# Patient Record
Sex: Female | Born: 1990 | Race: White | Hispanic: No | Smoking: Current some day smoker
Health system: Southern US, Community
[De-identification: ages and names within clinical notes are randomized; demographics above are authoritative.]

## PROBLEM LIST (undated history)

## (undated) DIAGNOSIS — I10 Essential (primary) hypertension: Secondary | ICD-10-CM

---

## 2014-05-26 ENCOUNTER — Emergency Department (HOSPITAL_COMMUNITY): Payer: Self-pay

## 2014-05-26 ENCOUNTER — Encounter (HOSPITAL_COMMUNITY): Payer: Self-pay | Admitting: *Deleted

## 2014-05-26 ENCOUNTER — Emergency Department (HOSPITAL_COMMUNITY)
Admission: EM | Admit: 2014-05-26 | Discharge: 2014-05-26 | Disposition: A | Discharge status: Home / Self Care | Payer: Self-pay | Attending: Emergency Medicine | Admitting: Emergency Medicine

## 2014-05-26 DIAGNOSIS — Z23 Encounter for immunization: Secondary | ICD-10-CM | POA: Insufficient documentation

## 2014-05-26 DIAGNOSIS — Y9389 Activity, other specified: Secondary | ICD-10-CM | POA: Insufficient documentation

## 2014-05-26 DIAGNOSIS — Y9289 Other specified places as the place of occurrence of the external cause: Secondary | ICD-10-CM | POA: Insufficient documentation

## 2014-05-26 DIAGNOSIS — Z720 Tobacco use: Secondary | ICD-10-CM | POA: Insufficient documentation

## 2014-05-26 DIAGNOSIS — W01190A Fall on same level from slipping, tripping and stumbling with subsequent striking against furniture, initial encounter: Secondary | ICD-10-CM | POA: Insufficient documentation

## 2014-05-26 DIAGNOSIS — Z91040 Latex allergy status: Secondary | ICD-10-CM | POA: Insufficient documentation

## 2014-05-26 DIAGNOSIS — S0512XA Contusion of eyeball and orbital tissues, left eye, initial encounter: Secondary | ICD-10-CM

## 2014-05-26 DIAGNOSIS — S0990XA Unspecified injury of head, initial encounter: Secondary | ICD-10-CM

## 2014-05-26 DIAGNOSIS — Y998 Other external cause status: Secondary | ICD-10-CM | POA: Insufficient documentation

## 2014-05-26 DIAGNOSIS — S060X0A Concussion without loss of consciousness, initial encounter: Secondary | ICD-10-CM | POA: Insufficient documentation

## 2014-05-26 DIAGNOSIS — I10 Essential (primary) hypertension: Secondary | ICD-10-CM | POA: Insufficient documentation

## 2014-05-26 DIAGNOSIS — S0181XA Laceration without foreign body of other part of head, initial encounter: Secondary | ICD-10-CM | POA: Insufficient documentation

## 2014-05-26 HISTORY — DX: Essential (primary) hypertension: I10

## 2014-05-26 MED ORDER — IBUPROFEN 800 MG PO TABS
800.0000 mg | ORAL_TABLET | Freq: Once | ORAL | Status: AC
  Administered 2014-05-26: 800 mg via ORAL
  Filled 2014-05-26: qty 2

## 2014-05-26 MED ORDER — OXYCODONE-ACETAMINOPHEN 5-325 MG PO TABS
1.0000 | ORAL_TABLET | Freq: Once | ORAL | Status: AC
  Administered 2014-05-26: 1 via ORAL
  Filled 2014-05-26: qty 1

## 2014-05-26 MED ORDER — ONDANSETRON 8 MG PO TBDP
8.0000 mg | ORAL_TABLET | Freq: Once | ORAL | Status: AC
  Administered 2014-05-26: 8 mg via ORAL
  Filled 2014-05-26: qty 1

## 2014-05-26 MED ORDER — ONDANSETRON 4 MG PO TBDP
4.0000 mg | ORAL_TABLET | Freq: Once | ORAL | Status: AC
  Administered 2014-05-26: 4 mg via ORAL
  Filled 2014-05-26: qty 1

## 2014-05-26 MED ORDER — METOPROLOL TARTRATE 25 MG PO TABS: 25.0000 mg | ORAL_TABLET | Freq: Two times a day (BID) | ORAL | Status: AC

## 2014-05-26 MED ORDER — TETANUS-DIPHTH-ACELL PERTUSSIS 5-2.5-18.5 LF-MCG/0.5 IM SUSP
0.5000 mL | Freq: Once | INTRAMUSCULAR | Status: AC
  Administered 2014-05-26: 0.5 mL via INTRAMUSCULAR
  Filled 2014-05-26: qty 0.5

## 2014-05-26 NOTE — ED Notes (Signed)
Pt assisted to bathroom and then taken to CT

## 2014-05-26 NOTE — ED Provider Notes (Signed)
CSN: [id number]     Arrival date & time 05/26/14  0354 History   First MD Initiated Contact with Patient 05/26/14 0414     Chief Complaint  Patient presents with  . Fall     Patient is a 23 y.o. female presenting with head injury. The history is provided by the patient.  Head Injury Time since incident:  9 hours Pain details:    Quality:  Aching   Severity:  Severe   Timing:  Constant   Progression:  Worsening Chronicity:  New Relieved by:  Nothing Worsened by:  Movement Associated symptoms: blurred vision and headache   Associated symptoms: no focal weakness and no loss of consciousness   Patient reports that over 9 hours ago, she tripped and fell into a marble table.  She reports during the fall she struck the left side of her face causing bruising and laceration to her face.  She reports she was on the way to a photo shoot in [place] so she did not seek medical care immediately.  She reports after arriving for photo shoot it was cancelled as she was actively bleeding from her face.  She reports continued facial pain and headache.  She also reports feeling drowsy.  She reports blurred vision that is improving - she denies diplopia.  No focal weakness in her extremities.    She denies any other injuries.    Past Medical History  Diagnosis Date  . Hypertension    History reviewed. No pertinent past surgical history. History reviewed. No pertinent family history. History  Substance Use Topics  . Smoking status: Current Some Day Smoker  . Smokeless tobacco: Not on file  . Alcohol Use: No   OB History    No data available     Review of Systems  Eyes: Positive for blurred vision.  Cardiovascular: Negative for chest pain.  Gastrointestinal: Negative for abdominal pain.  Musculoskeletal: Negative for back pain.  Skin: Positive for wound.  Neurological: Positive for headaches. Negative for focal weakness and loss of consciousness.  All other systems reviewed and are  negative.     Allergies  Hydrocodone and Latex  Home Medications   Prior to Admission medications   Not on File   BP 159/89 mmHg  Pulse 122  Temp(Src) 98.1 F (36.7 C) (Oral)  Resp 26  Ht 5\' 3"  (1.6 m)  Wt 113 lb (51.256 kg)  BMI 20.02 kg/m2  SpO2 100% Physical Exam CONSTITUTIONAL: Well developed/well nourished HEAD: Normocephalic/atraumatic EYES: EOMI/PERRL. No proptosis.  No hyphema.  No subconjunctival hemorrhage noted. ENMT: Mucous membranes moist.  Left periorbital ecchymoses.  No facial crepitus or stepoff.  There is a laceration to left maxilla.  There is makeup over the wound.  There is no active bleeding.  No dental injury.  There is no nasal septal hematoma.  No hemotympanum NECK: supple no meningeal signs SPINE:entire spine nontender CV: S1/S2 noted, no murmurs/rubs/gallops noted LUNGS: Lungs are clear to auscultation bilaterally, no apparent distress ABDOMEN: soft, nontender, no rebound or guarding, no bruising noted NEURO: Pt is awake/alert, moves all extremitiesx4.  She initially appears unsteady while walking, however she corrects and has no ataxia.  GCS - 15 EXTREMITIES: pulses normal, full ROM. No wounds noted to either hand SKIN: warm, color normal PSYCH: anxious  ED Course  Procedures  4:33 AM Pt presents with left facial bruising/wound.  She reports it is over 9 hours ago that it occurred, however wound actually appears partially healed which would  indicate it might be older.  At this point, wound is not amenable for repair.   I asked if she felt safe at home and she reports that she does feel safe Nursing also asked patient this question and she reported feeling safe at home  She reports she has h/o HTN and does not have her meds - she reports she takes metoprolol 250mg  BID.  5:17 AM CT imaging negative Pt did have vomiting after oral pain meds, given zofran Discussed imaging results Also, will give Rx for her metoprolol but will start at low  dose. Advised need for outpatient management of her HTN  Imaging Review Ct Head Wo Contrast  05/26/2014   CLINICAL DATA:  Status post fall 1 day ago with a blow to the left side of the face. Laceration left cheek. Initial encounter.  EXAM: CT HEAD WITHOUT CONTRAST  CT MAXILLOFACIAL WITHOUT CONTRAST  TECHNIQUE: Multidetector CT imaging of the head and maxillofacial structures were performed using the standard protocol without intravenous contrast. Multiplanar CT image reconstructions of the maxillofacial structures were also generated.  COMPARISON:  None.  FINDINGS: CT HEAD FINDINGS  The brain appears normal without hemorrhage, infarct, mass lesion, mass effect, midline shift or abnormal extra-axial fluid collection. No hydrocephalus or pneumocephalus. The calvarium is intact. Imaged paranasal sinuses and mastoid air cells are clear.  CT MAXILLOFACIAL FINDINGS  Hematoma is seen over the left maxilla without underlying fracture. The facial bones are intact. The mandibular condyles are located. The globes are intact and the lenses are located. Orbital fat is clear. Imaged upper cervical spine appears normal. The paranasal sinuses and mastoid air cells are clear.  IMPRESSION: Soft tissue contusion over the left maxilla without underlying fracture.  Negative head CT.   Electronically Signed   By: Drusilla Kannerhomas  Dalessio M.D.   On: 05/26/2014 04:52   Ct Maxillofacial Wo Cm  05/26/2014   CLINICAL DATA:  Status post fall 1 day ago with a blow to the left side of the face. Laceration left cheek. Initial encounter.  EXAM: CT HEAD WITHOUT CONTRAST  CT MAXILLOFACIAL WITHOUT CONTRAST  TECHNIQUE: Multidetector CT imaging of the head and maxillofacial structures were performed using the standard protocol without intravenous contrast. Multiplanar CT image reconstructions of the maxillofacial structures were also generated.  COMPARISON:  None.  FINDINGS: CT HEAD FINDINGS  The brain appears normal without hemorrhage, infarct, mass  lesion, mass effect, midline shift or abnormal extra-axial fluid collection. No hydrocephalus or pneumocephalus. The calvarium is intact. Imaged paranasal sinuses and mastoid air cells are clear.  CT MAXILLOFACIAL FINDINGS  Hematoma is seen over the left maxilla without underlying fracture. The facial bones are intact. The mandibular condyles are located. The globes are intact and the lenses are located. Orbital fat is clear. Imaged upper cervical spine appears normal. The paranasal sinuses and mastoid air cells are clear.  IMPRESSION: Soft tissue contusion over the left maxilla without underlying fracture.  Negative head CT.   Electronically Signed   By: Drusilla Kannerhomas  Dalessio M.D.   On: 05/26/2014 04:52      MDM   Final diagnoses:  Head injury  Laceration of face, initial encounter  Periorbital contusion, left, initial encounter  Concussion without loss of consciousness, initial encounter    Nursing notes including past medical history and social history reviewed and considered in documentation Narcotic database reviewed and considered in decision making    Joya Gaskinsonald W Paddy Neis, MD 05/26/14 [phone]

## 2014-05-26 NOTE — ED Notes (Signed)
Pt c/o fall x 10 hours ago; pt states she fell on a marble table; pt has abrasion to left cheek; pt has swelling and bruising to left eye and cheek; pt states her head is hurting and she feels more drowsy than usual

## 2014-05-26 NOTE — Discharge Instructions (Signed)

## 2015-03-23 ENCOUNTER — Emergency Department (HOSPITAL_COMMUNITY)
Admission: EM | Admit: 2015-03-23 | Discharge: 2015-03-23 | Disposition: A | Discharge status: Home / Self Care | Payer: Medicaid - Out of State | Attending: Emergency Medicine | Admitting: Emergency Medicine

## 2015-03-23 ENCOUNTER — Emergency Department (HOSPITAL_COMMUNITY): Payer: Medicaid - Out of State

## 2015-03-23 ENCOUNTER — Encounter (HOSPITAL_COMMUNITY): Payer: Self-pay | Admitting: Emergency Medicine

## 2015-03-23 DIAGNOSIS — R102 Pelvic and perineal pain: Secondary | ICD-10-CM | POA: Diagnosis present

## 2015-03-23 DIAGNOSIS — N730 Acute parametritis and pelvic cellulitis: Secondary | ICD-10-CM | POA: Insufficient documentation

## 2015-03-23 DIAGNOSIS — Z720 Tobacco use: Secondary | ICD-10-CM | POA: Diagnosis not present

## 2015-03-23 DIAGNOSIS — I10 Essential (primary) hypertension: Secondary | ICD-10-CM | POA: Diagnosis not present

## 2015-03-23 DIAGNOSIS — Z3202 Encounter for pregnancy test, result negative: Secondary | ICD-10-CM | POA: Insufficient documentation

## 2015-03-23 DIAGNOSIS — N760 Acute vaginitis: Secondary | ICD-10-CM | POA: Insufficient documentation

## 2015-03-23 DIAGNOSIS — N898 Other specified noninflammatory disorders of vagina: Secondary | ICD-10-CM

## 2015-03-23 DIAGNOSIS — Z91040 Latex allergy status: Secondary | ICD-10-CM | POA: Insufficient documentation

## 2015-03-23 DIAGNOSIS — Z79899 Other long term (current) drug therapy: Secondary | ICD-10-CM | POA: Diagnosis not present

## 2015-03-23 DIAGNOSIS — R509 Fever, unspecified: Secondary | ICD-10-CM

## 2015-03-23 DIAGNOSIS — B9689 Other specified bacterial agents as the cause of diseases classified elsewhere: Secondary | ICD-10-CM

## 2015-03-23 LAB — URINALYSIS, ROUTINE W REFLEX MICROSCOPIC
BILIRUBIN URINE: NEGATIVE
Glucose, UA: NEGATIVE mg/dL
Hgb urine dipstick: NEGATIVE
KETONES UR: NEGATIVE mg/dL
LEUKOCYTES UA: NEGATIVE
Nitrite: NEGATIVE
PH: 6 (ref 5.0–8.0)
PROTEIN: NEGATIVE mg/dL
Specific Gravity, Urine: 1.015 (ref 1.005–1.030)
Urobilinogen, UA: 0.2 mg/dL (ref 0.0–1.0)

## 2015-03-23 LAB — WET PREP, GENITAL
Trich, Wet Prep: NONE SEEN
YEAST WET PREP: NONE SEEN

## 2015-03-23 LAB — RPR: RPR Ser Ql: NONREACTIVE

## 2015-03-23 LAB — HIV ANTIBODY (ROUTINE TESTING W REFLEX): HIV Screen 4th Generation wRfx: NONREACTIVE

## 2015-03-23 LAB — PREGNANCY, URINE: PREG TEST UR: NEGATIVE

## 2015-03-23 MED ORDER — DOXYCYCLINE HYCLATE 100 MG PO CAPS: 100.0000 mg | ORAL_CAPSULE | Freq: Two times a day (BID) | ORAL | Status: AC

## 2015-03-23 MED ORDER — CEFTRIAXONE SODIUM 250 MG IJ SOLR
250.0000 mg | Freq: Once | INTRAMUSCULAR | Status: AC
  Administered 2015-03-23: 250 mg via INTRAMUSCULAR
  Filled 2015-03-23: qty 250

## 2015-03-23 MED ORDER — STERILE WATER FOR INJECTION IJ SOLN
INTRAMUSCULAR | Status: AC
  Administered 2015-03-23: 10 mL
  Filled 2015-03-23: qty 10

## 2015-03-23 MED ORDER — NAPROXEN 500 MG PO TABS: ORAL_TABLET | ORAL | Status: AC

## 2015-03-23 MED ORDER — DOXYCYCLINE HYCLATE 100 MG PO TABS
100.0000 mg | ORAL_TABLET | Freq: Once | ORAL | Status: AC
  Administered 2015-03-23: 100 mg via ORAL
  Filled 2015-03-23: qty 1

## 2015-03-23 MED ORDER — METRONIDAZOLE 500 MG PO TABS
500.0000 mg | ORAL_TABLET | Freq: Once | ORAL | Status: AC
  Administered 2015-03-23: 500 mg via ORAL
  Filled 2015-03-23: qty 1

## 2015-03-23 MED ORDER — ACETAMINOPHEN 500 MG PO TABS
1000.0000 mg | ORAL_TABLET | Freq: Once | ORAL | Status: AC
  Administered 2015-03-23: 1000 mg via ORAL
  Filled 2015-03-23: qty 2

## 2015-03-23 MED ORDER — METRONIDAZOLE 500 MG PO TABS: 500.0000 mg | ORAL_TABLET | Freq: Two times a day (BID) | ORAL | Status: AC

## 2015-03-23 MED ORDER — KETOROLAC TROMETHAMINE 60 MG/2ML IM SOLN
60.0000 mg | Freq: Once | INTRAMUSCULAR | Status: AC
  Administered 2015-03-23: 60 mg via INTRAMUSCULAR
  Filled 2015-03-23: qty 2

## 2015-03-23 MED ORDER — ONDANSETRON 4 MG PO TBDP
4.0000 mg | ORAL_TABLET | Freq: Once | ORAL | Status: AC
  Administered 2015-03-23: 4 mg via ORAL
  Filled 2015-03-23: qty 1

## 2015-03-23 MED ORDER — OXYCODONE HCL 5 MG PO TABS
5.0000 mg | ORAL_TABLET | Freq: Once | ORAL | Status: AC
Start: 2015-03-23 — End: 2015-03-23
  Administered 2015-03-23: 5 mg via ORAL
  Filled 2015-03-23: qty 1

## 2015-03-23 NOTE — ED Notes (Signed)
Ultrasound tech to be called in.

## 2015-03-23 NOTE — ED Provider Notes (Signed)
24 year old female with a chief complaint of pelvic pain. Seen initially by Dr. Lynelle Doctor, ultrasound of the pelvis negative for acute etiology. Patient is being treated for PID as well as BV. Patient requesting narcotics feels like her home treatment is not working. Discussed in detail reasoning for not giving her opiates. Discussed the dosage and frequency of common NSAIDs as well as Tylenol dosing.  Melene Plan, DO 03/23/15 [phone]

## 2015-03-23 NOTE — ED Provider Notes (Signed)
CSN: [id number]     Arrival date & time 03/23/15  0320 History   First MD Initiated Contact with Patient 03/23/15 [phone]     Chief Complaint  Patient presents with  . Pelvic Pain  . Vaginal Discharge     (Consider location/radiation/quality/duration/timing/severity/associated sxs/prior Treatment) HPI  Patient is G1 P1 Ab0, states she has a Mirena for the past 4 years. Patient states she started having pelvic pain 1 month ago and has dyspareunia. She also reports moderate amount of watery discharge that is foul-smelling. She has taken over-the-counter creams without improvement. She reports she has had fever of 99.9 and 1-1/2 weeks ago was 102.2. She did not seek medical care at that time because she did not have insurance. She states she now has Medicaid. She has had nausea and some vomiting off and on. She also reports her legs in her thighs are painful. She denies dysuria but she does report urinating small amounts frequently. She states she's never had these symptoms before.  GYN Dr Christain Sacramento in [place] appt 9/13  Past Medical History  Diagnosis Date  . Hypertension    History reviewed. No pertinent past surgical history. History reviewed. No pertinent family history. Social History  Substance Use Topics  . Smoking status: Current Some Day Smoker  . Smokeless tobacco: None  . Alcohol Use: No  smokes 1/3 ppad unemployed  OB History    No data available     Review of Systems  All other systems reviewed and are negative.     Allergies  Hydrocodone and Latex  Home Medications   Prior to Admission medications   Medication Sig Start Date End Date Taking? Authorizing Provider  doxycycline (VIBRAMYCIN) 100 MG capsule Take 1 capsule (100 mg total) by mouth 2 (two) times daily. 03/23/15   Devoria Albe, MD  metoprolol (LOPRESSOR) 25 MG tablet Take 1 tablet (25 mg total) by mouth 2 (two) times daily. 05/26/14   Zadie Rhine, MD  metroNIDAZOLE (FLAGYL) 500 MG tablet Take 1 tablet (500  mg total) by mouth 2 (two) times daily. 03/23/15   Devoria Albe, MD  naproxen (NAPROSYN) 500 MG tablet Take 1 po BID with food prn pain 03/23/15   Devoria Albe, MD   BP 135/60 mmHg  Pulse 103  Temp(Src) 97.9 F (36.6 C) (Oral)  Resp 20  Ht  (1.6 m)  Wt 125 lb (56.7 kg)  BMI 22.15 kg/m2  SpO2 100%  Vital signs normal except for tachycardia  Physical Exam  Constitutional: She is oriented to person, place, and time. She appears well-developed and well-nourished.  Non-toxic appearance. She does not appear ill. No distress.  HENT:  Head: Normocephalic and atraumatic.  Right Ear: External ear normal.  Left Ear: External ear normal.  Nose: Nose normal. No mucosal edema or rhinorrhea.  Mouth/Throat: Oropharynx is clear and moist and mucous membranes are normal. No dental abscesses or uvula swelling.  Eyes: Conjunctivae and EOM are normal. Pupils are equal, round, and reactive to light.  Neck: Normal range of motion and full passive range of motion without pain. Neck supple.  Cardiovascular: Normal rate, regular rhythm and normal heart sounds.  Exam reveals no gallop and no friction rub.   No murmur heard. Pulmonary/Chest: Effort normal and breath sounds normal. No respiratory distress. She has no wheezes. She has no rhonchi. She has no rales. She exhibits no tenderness and no crepitus.  Abdominal: Soft. Normal appearance and bowel sounds are normal. She exhibits no distension. There is tenderness.  There is no rebound and no guarding.    Genitourinary:  Patient string from her Mirena was seen in the cervical os. There is a lot of white discharge in the vault. Her uterus is normal-sized and is tender to manipulation. Her left ovary is tender without healing enlarged. Her right ovary is tender and appears to be enlarged.  Musculoskeletal: Normal range of motion. She exhibits no edema or tenderness.  Moves all extremities well.   Neurological: She is alert and oriented to person, place, and time.  She has normal strength. No cranial nerve deficit.  Skin: Skin is warm, dry and intact. No rash noted. No erythema. No pallor.  Psychiatric: She has a normal mood and affect. Her speech is normal and behavior is normal. Her mood appears not anxious.  Nursing note and vitals reviewed.   ED Course  Procedures (including critical care time) Medications  ketorolac (TORADOL) injection 60 mg (60 mg Intramuscular Given 03/23/15 0549)  cefTRIAXone (ROCEPHIN) injection 250 mg (250 mg Intramuscular Given 03/23/15 0549)  doxycycline (VIBRA-TABS) tablet 100 mg (100 mg Oral Given 03/23/15 0549)  metroNIDAZOLE (FLAGYL) tablet 500 mg (500 mg Oral Given 03/23/15 0549)  ondansetron (ZOFRAN-ODT) disintegrating tablet 4 mg (4 mg Oral Given 03/23/15 0549)  sterile water (preservative free) injection (10 mLs  Given 03/23/15 0549)     In and out cath done due to her complaints of vaginal discharge. Patient was reluctant to have the procedure done.  Patient was given Toradol 60 mg IM after her pelvic exam. She was given Rocephin IM and started on the STD oral medications.  6:30 AM I talked to patient about her lab results. I feel due to the history of fever and her pain being more on the right side a pelvic ultrasound should be done to rule out a TOA. Patient also has a IUD in its placement needs to be verified. Patient is agreeable. The ultrasound tech is on call today because of the holiday (Labor Day) and is being called in. She states she still has some pressure discomfort in her abdomen. She appears less distressed after the toradol.   Pt turned over to Dr Adela Lank at change of shift to get results of her pelvic US/transvaginal US.    Review of the [place] database shows patient got #20 oxycodone 5/325 on 05/08/2014 and #10 oxycodone 5/325 on 07/09/2014. There are no entries in the [employer] Review Results for orders placed or performed during the hospital encounter of 03/23/15  Wet prep, genital    Result Value Ref Range   Yeast Wet Prep HPF POC NONE SEEN NONE SEEN   Trich, Wet Prep NONE SEEN NONE SEEN   Clue Cells Wet Prep HPF POC MANY (A) NONE SEEN   WBC, Wet Prep HPF POC MANY (A) NONE SEEN  Pregnancy, urine  Result Value Ref Range   Preg Test, Ur NEGATIVE NEGATIVE  Urinalysis, Routine w reflex microscopic  Result Value Ref Range   Color, Urine YELLOW YELLOW   APPearance CLEAR CLEAR   Specific Gravity, Urine 1.015 1.005 - 1.030   pH 6.0 5.0 - 8.0   Glucose, UA NEGATIVE NEGATIVE mg/dL   Hgb urine dipstick NEGATIVE NEGATIVE   Bilirubin Urine NEGATIVE NEGATIVE   Ketones, ur NEGATIVE NEGATIVE mg/dL   Protein, ur NEGATIVE NEGATIVE mg/dL   Urobilinogen, UA 0.2 0.0 - 1.0 mg/dL   Nitrite NEGATIVE NEGATIVE   Leukocytes, UA NEGATIVE NEGATIVE   Laboratory interpretation all normal except for bacterial  vaginosis     Imaging Review No results found.   Pelvic US pending  I have personally reviewed and evaluated these images and lab results as part of my medical decision-making.   EKG Interpretation None      MDM   Final diagnoses:  Vaginal discharge  Pelvic pain in female  Acute PID (pelvic inflammatory disease)  BV (bacterial vaginosis)    New Prescriptions   DOXYCYCLINE (VIBRAMYCIN) 100 MG CAPSULE    Take 1 capsule (100 mg total) by mouth 2 (two) times daily.   METRONIDAZOLE (FLAGYL) 500 MG TABLET    Take 1 tablet (500 mg total) by mouth 2 (two) times daily.   NAPROXEN (NAPROSYN) 500 MG TABLET    Take 1 po BID with food prn pain    Plan discharge  Devoria Albe, MD, Concha Pyo, MD 03/23/15 [phone]

## 2015-03-23 NOTE — ED Notes (Signed)
Patient complaining of pelvic pain, watery and white vaginal discharge. Patient states has had symptoms for about a month.

## 2015-03-23 NOTE — Discharge Instructions (Signed)
Take the antibiotics until gone. Take the naproxen for pain. Keep your appointment with Dr. Christain Sacramento on September 13. No sex until Dr. Christain Sacramento states it is okay. Return to the ED if you get high fever again, have uncontrolled vomiting, or worsening pain.   Bacterial Vaginosis Bacterial vaginosis is a vaginal infection that occurs when the normal balance of bacteria in the vagina is disrupted. It results from an overgrowth of certain bacteria. This is the most common vaginal infection in women of childbearing age. Treatment is important to prevent complications, especially in pregnant women, as it can cause a premature delivery. CAUSES  Bacterial vaginosis is caused by an increase in harmful bacteria that are normally present in smaller amounts in the vagina. Several different kinds of bacteria can cause bacterial vaginosis. However, the reason that the condition develops is not fully understood. RISK FACTORS Certain activities or behaviors can put you at an increased risk of developing bacterial vaginosis, including:  Having a new sex partner or multiple sex partners.  Douching.  Using an intrauterine device (IUD) for contraception. Women do not get bacterial vaginosis from toilet seats, bedding, swimming pools, or contact with objects around them. SIGNS AND SYMPTOMS  Some women with bacterial vaginosis have no signs or symptoms. Common symptoms include:  Grey vaginal discharge.  A fishlike odor with discharge, especially after sexual intercourse.  Itching or burning of the vagina and vulva.  Burning or pain with urination. DIAGNOSIS  Your health care provider will take a medical history and examine the vagina for signs of bacterial vaginosis. A sample of vaginal fluid may be taken. Your health care provider will look at this sample under a microscope to check for bacteria and abnormal cells. A vaginal pH test may also be done.  TREATMENT  Bacterial vaginosis may be treated with antibiotic  medicines. These may be given in the form of a pill or a vaginal cream. A second round of antibiotics may be prescribed if the condition comes back after treatment.  HOME CARE INSTRUCTIONS   Only take over-the-counter or prescription medicines as directed by your health care provider.  If antibiotic medicine was prescribed, take it as directed. Make sure you finish it even if you start to feel better.  Do not have sex until treatment is completed.  Tell all sexual partners that you have a vaginal infection. They should see their health care provider and be treated if they have problems, such as a mild rash or itching.  Practice safe sex by using condoms and only having one sex partner. SEEK MEDICAL CARE IF:   Your symptoms are not improving after 3 days of treatment.  You have increased discharge or pain.  You have a fever. MAKE SURE YOU:   Understand these instructions.  Will watch your condition.  Will get help right away if you are not doing well or get worse. FOR MORE INFORMATION  Centers for Disease Control and Prevention, Division of STD Prevention: [employer] American Sexual Health Association (ASHA): www.ashastd.org  Document Released: 07/04/2005 Document Revised: 04/24/2013 Document Reviewed: 02/13/2013 [facility] Patient Information 2015 [place], [place]. This information is not intended to replace advice given to you by your health care provider. Make sure you discuss any questions you have with your health care provider.  Pelvic Inflammatory Disease Pelvic inflammatory disease (PID) is an infection in some or all of the female organs. PID can be in the uterus, ovaries, fallopian tubes, or the surrounding tissues inside the lower belly area (pelvis). HOME  CARE   If given, take your antibiotic medicine as told. Finish them even if you start to feel better.  Only take medicine as told by your doctor.  Do not have sex (intercourse) until treatment is done or as told  by your doctor.  Tell your sex partner if you have PID. Your partner may need to be treated.  Keep all doctor visits. GET HELP RIGHT AWAY IF:   You have a fever.  You have more belly (abdominal) or lower belly pain.  You have chills.  You have pain when you pee (urinate).  You are not better after 72 hours.  You have more fluid (discharge) coming from your vagina or fluid that is not normal.  You need pain medicine from your doctor.  You throw up (vomit).  You cannot take your medicines.  Your partner has a sexually transmitted disease (STD). MAKE SURE YOU:   Understand these instructions.  Will watch your condition.  Will get help right away if you are not doing well or get worse. Document Released: 09/30/2008 Document Revised: 10/29/2012 Document Reviewed: 06/30/2011 [facility] Patient Information 2015 [place], [place]. This information is not intended to replace advice given to you by your health care provider. Make sure you discuss any questions you have with your health care provider.

## 2015-03-24 LAB — GC/CHLAMYDIA PROBE AMP (~~LOC~~) NOT AT ARMC
Chlamydia: NEGATIVE
NEISSERIA GONORRHEA: NEGATIVE

## 2015-09-10 IMAGING — US US TRANSVAGINAL NON-OB
1 series · 14 of 25 positions shown · non-contrast
Comparison: [facility] [HOSP[facility]] CT Abdomen and Pelvis
01/06/2014

CLINICAL DATA: 24-year-old female with right side pelvic pain,
fever, vaginal discharge. IUD. Initial encounter.



[Series 1: us transvaginal non-ob · 0.20mm/px · 65 acquisitions, 14 frames shown]
[im 1/65]
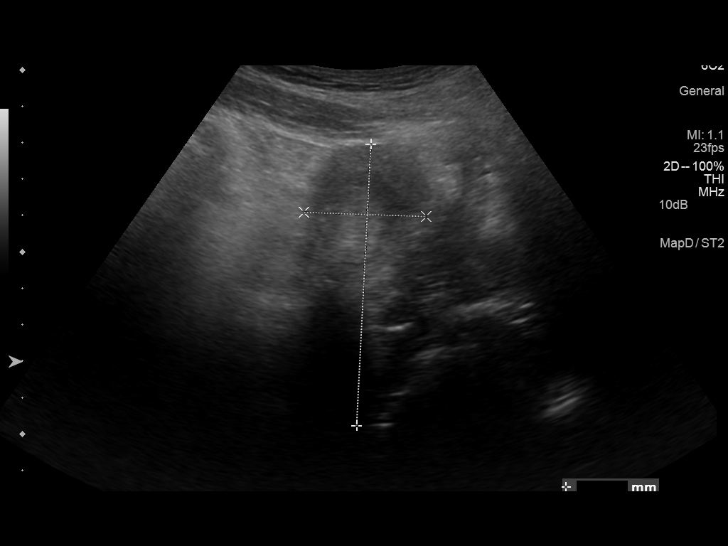
[im 6/65]
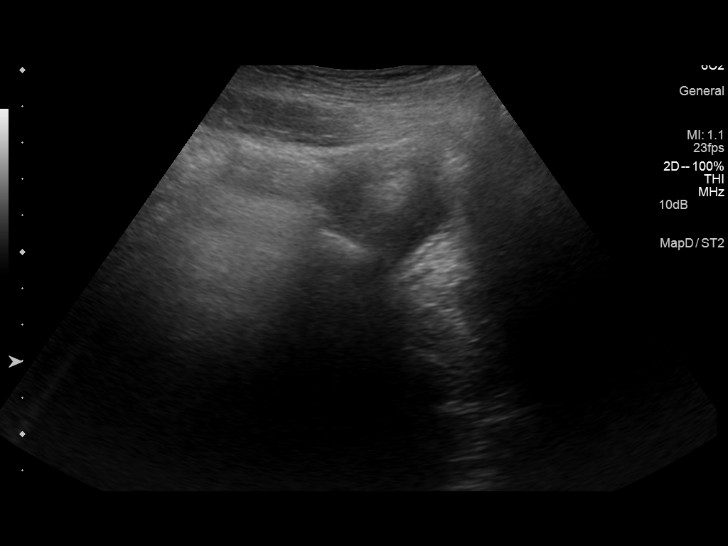
[im 11/65]
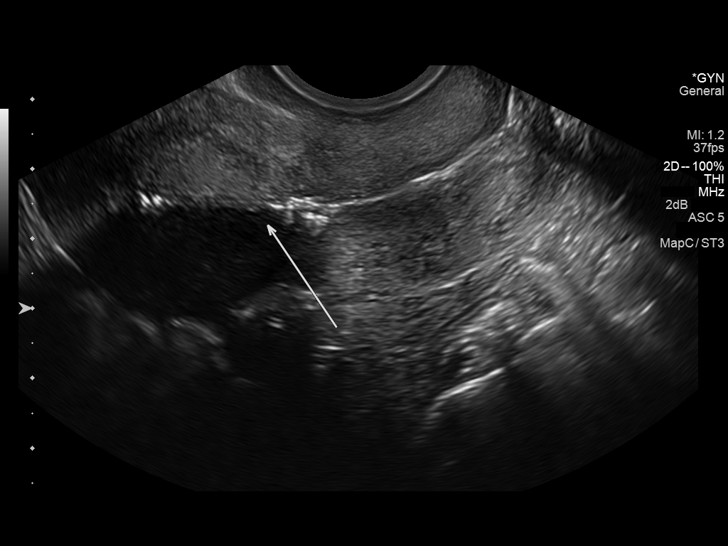
[im 17/65]
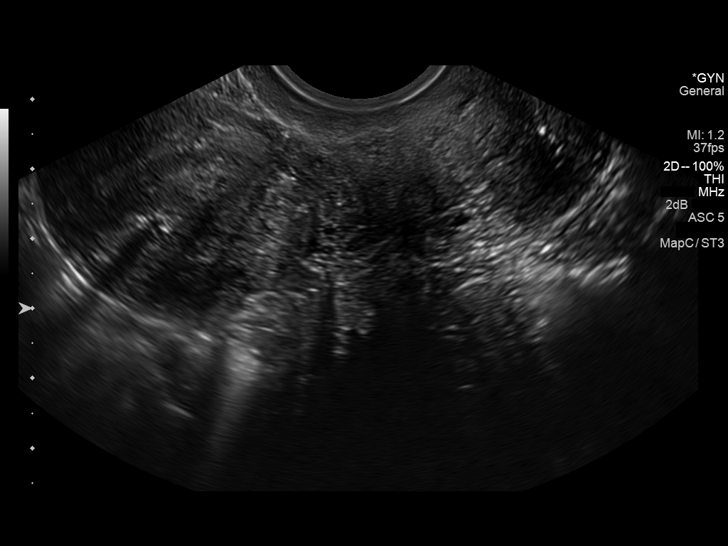
[im 22/65]
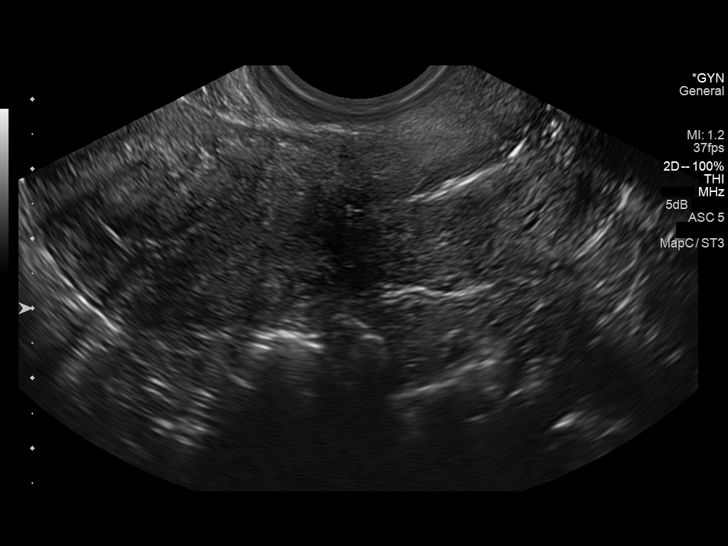
[im 25/65]
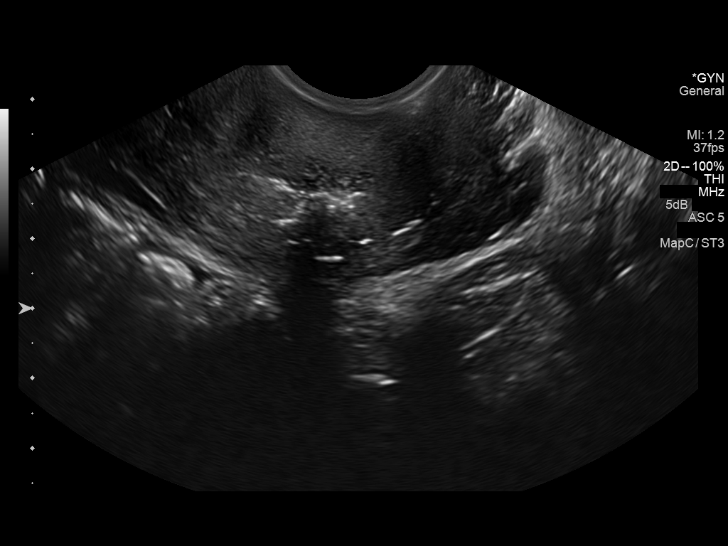
[im 30/65]
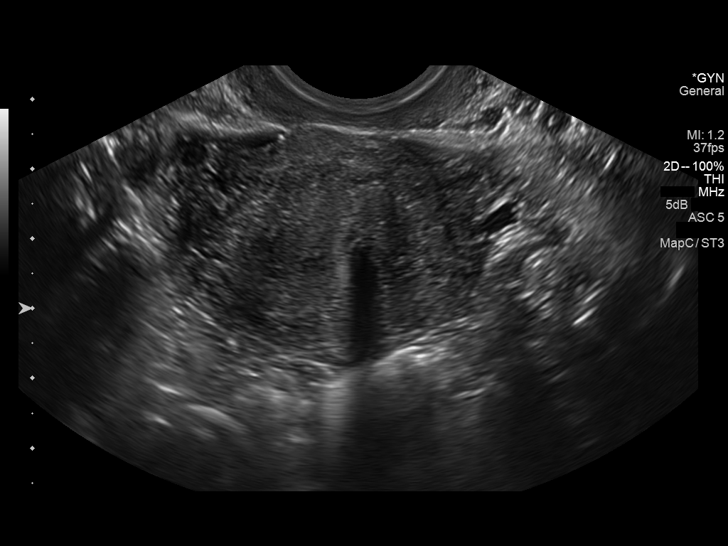
[im 35/65]
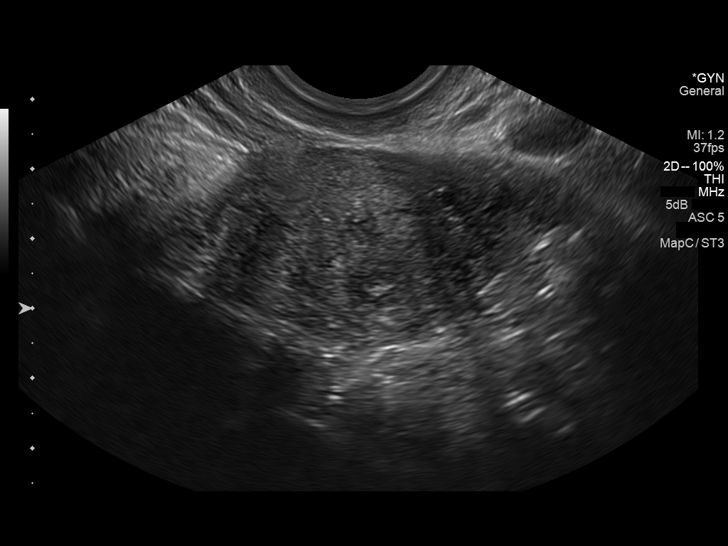
[im 41/65]
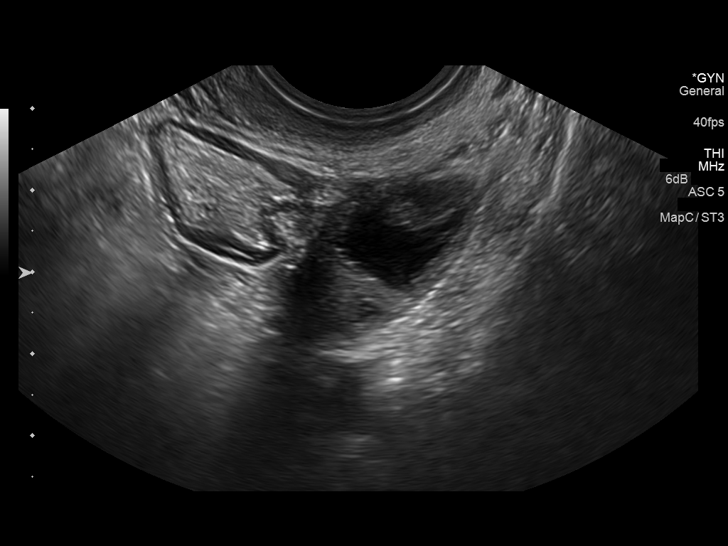
[im 43/65]
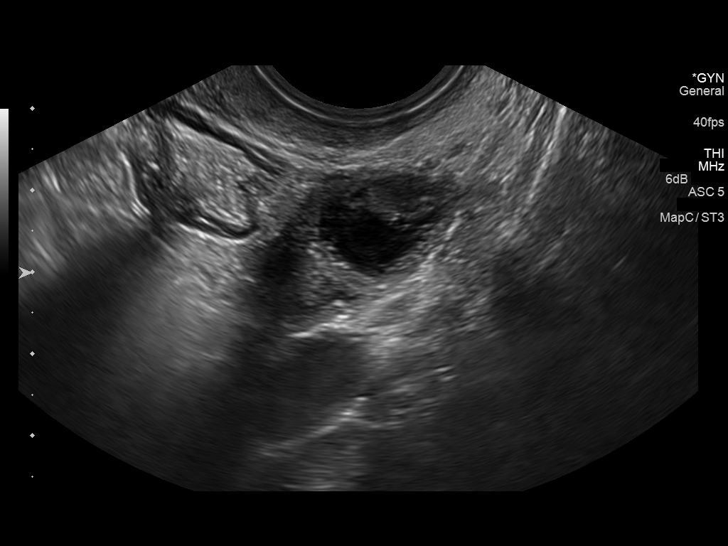
[im 49/65]
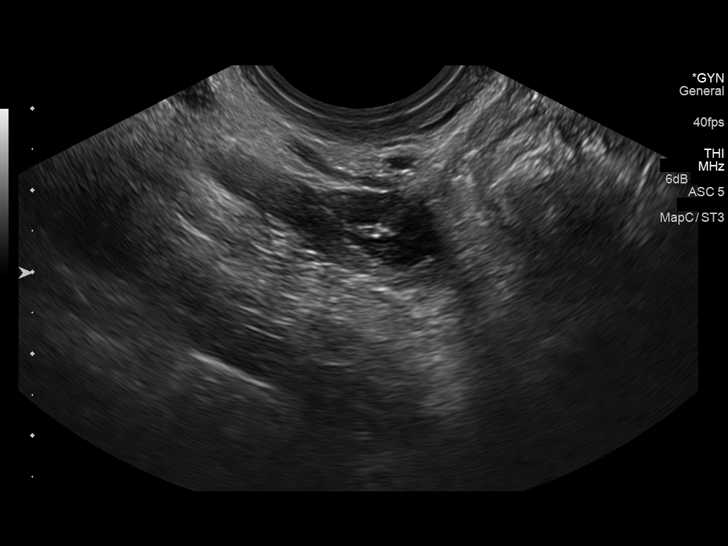
[im 54/65]
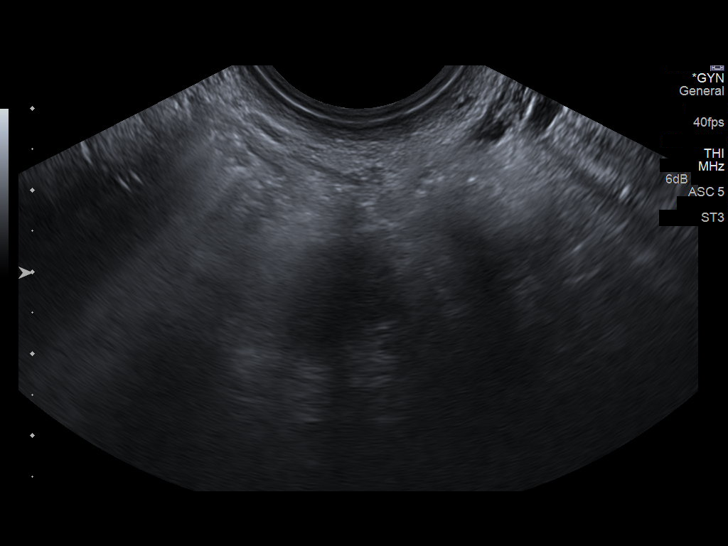
[im 59/65]
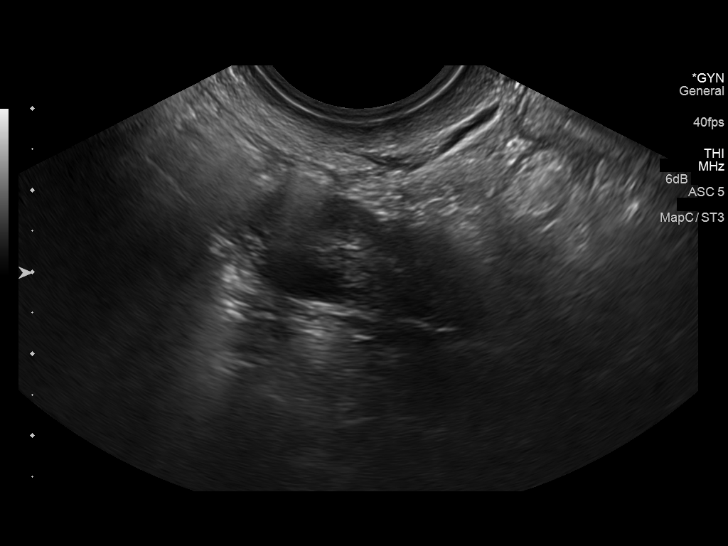
[im 65/65]
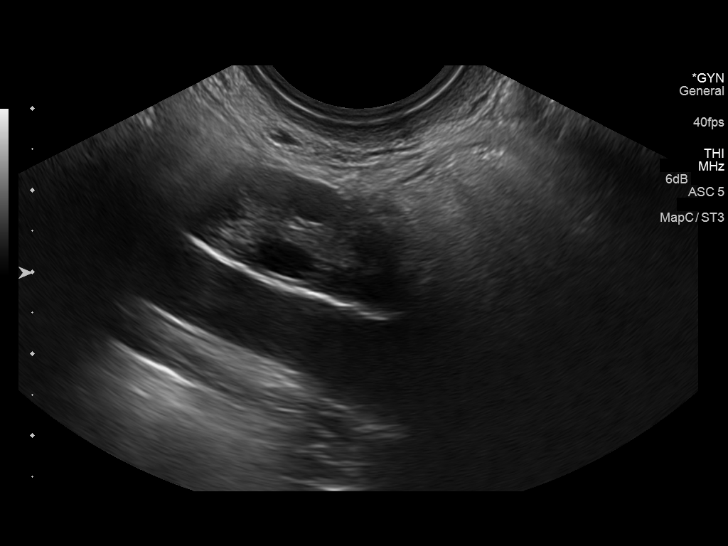

[14 of 25 positions shown; findings below may reference images not displayed]

FINDINGS: Uterus

Measurements: 8.0 x 3.5 x 5.2 cm. No fibroids or other mass
visualized.

Endometrium

Thickness: 3 mm. IUD visible (image 12) and position appears stable.
No focal abnormality visualized.

Right ovary

Measurements: 2.4 x 1.4 x 2.1 cm. Small follicles. Normal
appearance/no adnexal mass.

Left ovary

Measurements: 3.7 x 1.5 x 1.9 cm. Mildly thick-walled 17 mm cyst,
probable corpus luteum. Normal appearance/no adnexal mass.

Other findings

No free fluid.
IMPRESSION: Negative sonographic appearance of the pelvis.  Stable IUD.
